# Patient Record
Sex: Male | Born: 1990 | Race: White | Hispanic: No | Marital: Single | Smoking: Current every day smoker
Health system: Southern US, Community
[De-identification: ages and names within clinical notes are randomized; demographics above are authoritative.]

---
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
## 2011-01-15 ENCOUNTER — Emergency Department: Payer: Self-pay | Admitting: Emergency Medicine

## 2011-04-18 LAB — I-STAT 8, (EC8 V) (CONVERTED LAB)
Acid-Base Excess: 1
BUN: 16
Bicarbonate: 25.1 — ABNORMAL HIGH
Chloride: 104
HCT: 50 — ABNORMAL HIGH
Hemoglobin: 17 — ABNORMAL HIGH
Operator id: 272551
Sodium: 139

## 2011-04-18 LAB — URINALYSIS, ROUTINE W REFLEX MICROSCOPIC
Bilirubin Urine: NEGATIVE
Hgb urine dipstick: NEGATIVE
Ketones, ur: NEGATIVE
Nitrite: NEGATIVE
Specific Gravity, Urine: 1.023
pH: 6.5

## 2011-04-18 LAB — POCT I-STAT CREATININE: Creatinine, Ser: 1.1

## 2011-04-18 LAB — DIFFERENTIAL
Basophils Absolute: 0
Basophils Relative: 0
Eosinophils Absolute: 0.1
Eosinophils Relative: 1
Monocytes Absolute: 0.6
Monocytes Relative: 10
Neutro Abs: 3.7

## 2011-04-18 LAB — GC/CHLAMYDIA PROBE AMP, URINE: Chlamydia, Swab/Urine, PCR: NEGATIVE

## 2011-04-18 LAB — CBC
HCT: 45.7
Hemoglobin: 16
MCHC: 34.9
MCV: 85.3
RBC: 5.35
RDW: 13

## 2011-04-18 LAB — ETHANOL: Alcohol, Ethyl (B): <5

## 2011-04-18 LAB — HEPATIC FUNCTION PANEL
ALT: 21
AST: 22
Albumin: 4.3
Alkaline Phosphatase: 143
Bilirubin, Direct: 0.1
Total Bilirubin: 0.6

## 2011-04-18 LAB — RPR: RPR Ser Ql: NONREACTIVE

## 2011-04-18 LAB — RAPID URINE DRUG SCREEN, HOSP PERFORMED
Cocaine: NOT DETECTED
Opiates: NOT DETECTED

## 2016-10-02 ENCOUNTER — Encounter: Payer: Self-pay | Admitting: Emergency Medicine

## 2016-10-02 ENCOUNTER — Ambulatory Visit
Admission: EM | Admit: 2016-10-02 | Discharge: 2016-10-02 | Disposition: A | Discharge status: Home / Self Care | Payer: Self-pay | Attending: Internal Medicine | Admitting: Internal Medicine

## 2016-10-02 DIAGNOSIS — R69 Illness, unspecified: Secondary | ICD-10-CM

## 2016-10-02 DIAGNOSIS — J111 Influenza due to unidentified influenza virus with other respiratory manifestations: Secondary | ICD-10-CM

## 2016-10-02 DIAGNOSIS — J029 Acute pharyngitis, unspecified: Secondary | ICD-10-CM

## 2016-10-02 DIAGNOSIS — R05 Cough: Secondary | ICD-10-CM

## 2016-10-02 DIAGNOSIS — R0981 Nasal congestion: Secondary | ICD-10-CM

## 2016-10-02 MED ORDER — OSELTAMIVIR PHOSPHATE 75 MG PO CAPS: 75.0000 mg | ORAL_CAPSULE | Freq: Two times a day (BID) | ORAL | 0 refills | Status: DC

## 2016-10-02 NOTE — ED Provider Notes (Signed)
[facility]    CSN: [id number] Arrival date & time: 10/02/16  1508     History   Chief Complaint Chief Complaint  Patient presents with  . Nasal Congestion  . Otalgia  . Cough  . Sore Throat    HPI Dakota Allen is a 26 y.o. male.   Pt c/o cough and congestion for a week that has worsened to head congestion and dizziness especially after trying to blow his nose.  Also c/o general malaise.        History reviewed. No pertinent past medical history.  There are no active problems to display for this patient.   History reviewed. No pertinent surgical history.     Home Medications    Prior to Admission medications   Medication Sig Start Date End Date Taking? Authorizing Provider  oseltamivir (TAMIFLU) 75 MG capsule Take 1 capsule (75 mg total) by mouth every 12 (twelve) hours. 10/02/16   Arnaldo Natal, MD    Family History History reviewed. No pertinent family history.  Social History Social History  Substance Use Topics  . Smoking status: Current Every Day Smoker    Packs/day: 1.00    Types: Cigarettes  . Smokeless tobacco: Never Used  . Alcohol use Yes     Allergies   Patient has no known allergies.   Review of Systems Review of Systems  Constitutional: Negative for chills and fever.  HENT: Negative for sore throat and tinnitus.   Eyes: Negative for redness.  Respiratory: Positive for cough. Negative for shortness of breath.   Cardiovascular: Negative for chest pain and palpitations.  Gastrointestinal: Positive for nausea. Negative for abdominal pain, diarrhea and vomiting.  Genitourinary: Negative for dysuria, frequency and urgency.  Musculoskeletal: Negative for myalgias.  Skin: Negative for rash.       No lesions  Neurological: Positive for dizziness. Negative for weakness.  Hematological: Does not bruise/bleed easily.  Psychiatric/Behavioral: Negative for suicidal ideas.     Physical Exam Triage Vital Signs ED Triage  Vitals  Enc Vitals Group     BP 10/02/16 1540 120/82     Pulse Rate 10/02/16 1540 77     Resp 10/02/16 1540 16     Temp 10/02/16 1540 98 F (36.7 C)     Temp Source 10/02/16 1540 Oral     SpO2 10/02/16 1540 99 %     Weight 10/02/16 1538 130 lb (59 kg)     Height --      Head Circumference --      Peak Flow --      Pain Score 10/02/16 1539 0     Pain Loc --      Pain Edu? --      Excl. in GC? --    No data found.   Updated Vital Signs BP 120/82 (BP Location: Left Arm)   Pulse 77   Temp 98 F (36.7 C) (Oral)   Resp 16   Wt 130 lb (59 kg)   SpO2 99%   Visual Acuity Right Eye Distance:   Left Eye Distance:   Bilateral Distance:    Right Eye Near:   Left Eye Near:    Bilateral Near:     Physical Exam  Constitutional: He is oriented to person, place, and time. He appears well-developed and well-nourished. No distress.  HENT:  Head: Normocephalic and atraumatic.  Mouth/Throat: Oropharynx is clear and moist.  Eyes: Conjunctivae and EOM are normal. Pupils are equal, round, and reactive to light.  No scleral icterus.  Neck: Normal range of motion. Neck supple. No JVD present. No tracheal deviation present. No thyromegaly present.  Cardiovascular: Normal rate, regular rhythm and normal heart sounds.  Exam reveals no gallop and no friction rub.   No murmur heard. Pulmonary/Chest: Effort normal and breath sounds normal. No respiratory distress.  Abdominal: Soft. Bowel sounds are normal. He exhibits no distension. There is no tenderness.  Musculoskeletal: Normal range of motion. He exhibits no edema.  Lymphadenopathy:    He has cervical adenopathy.  Neurological: He is alert and oriented to person, place, and time. No cranial nerve deficit.  Skin: Skin is warm and dry. No rash noted. No erythema.  Psychiatric: He has a normal mood and affect. His behavior is normal. Judgment and thought content normal.     UC Treatments / Results  Labs (all labs ordered are listed, but  only abnormal results are displayed) Labs Reviewed - No data to display  EKG  EKG Interpretation None       Radiology No results found.  Procedures Procedures (including critical care time)  Medications Ordered in UC Medications - No data to display   Initial Impression / Assessment and Plan / UC Course  I have reviewed the triage vital signs and the nursing notes.  Pertinent labs & imaging results that were available during my care of the patient were reviewed by me and considered in my medical decision making (see chart for details).     Unspecified viral URI that may have predisposed pt to Influenza-like illness.  Works at [employer] with many probable influenza positive contacts.  Empiric tx.   Final Clinical Impressions(s) / UC Diagnoses   Final diagnoses:  Influenza-like illness    New Prescriptions New Prescriptions   OSELTAMIVIR (TAMIFLU) 75 MG CAPSULE    Take 1 capsule (75 mg total) by mouth every 12 (twelve) hours.     Arnaldo Natal, MD 10/02/16 [phone]

## 2016-10-02 NOTE — ED Triage Notes (Signed)
Patient c/o cough, nasal congestion, and ear pressure for a week.

## 2017-08-06 ENCOUNTER — Emergency Department
Admission: EM | Admit: 2017-08-06 | Discharge: 2017-08-06 | Disposition: A | Discharge status: Home / Self Care | Payer: Worker's Compensation | Attending: Emergency Medicine | Admitting: Emergency Medicine

## 2017-08-06 ENCOUNTER — Encounter: Payer: Self-pay | Admitting: Emergency Medicine

## 2017-08-06 ENCOUNTER — Emergency Department: Payer: Worker's Compensation

## 2017-08-06 DIAGNOSIS — W230XXA Caught, crushed, jammed, or pinched between moving objects, initial encounter: Secondary | ICD-10-CM | POA: Insufficient documentation

## 2017-08-06 DIAGNOSIS — S99921A Unspecified injury of right foot, initial encounter: Secondary | ICD-10-CM | POA: Diagnosis present

## 2017-08-06 DIAGNOSIS — S92534A Nondisplaced fracture of distal phalanx of right lesser toe(s), initial encounter for closed fracture: Secondary | ICD-10-CM | POA: Diagnosis not present

## 2017-08-06 DIAGNOSIS — Z23 Encounter for immunization: Secondary | ICD-10-CM | POA: Diagnosis not present

## 2017-08-06 DIAGNOSIS — S91209A Unspecified open wound of unspecified toe(s) with damage to nail, initial encounter: Secondary | ICD-10-CM

## 2017-08-06 DIAGNOSIS — Y999 Unspecified external cause status: Secondary | ICD-10-CM | POA: Insufficient documentation

## 2017-08-06 DIAGNOSIS — Y9389 Activity, other specified: Secondary | ICD-10-CM | POA: Insufficient documentation

## 2017-08-06 DIAGNOSIS — F17210 Nicotine dependence, cigarettes, uncomplicated: Secondary | ICD-10-CM | POA: Insufficient documentation

## 2017-08-06 DIAGNOSIS — S91114A Laceration without foreign body of right lesser toe(s) without damage to nail, initial encounter: Secondary | ICD-10-CM | POA: Insufficient documentation

## 2017-08-06 DIAGNOSIS — Z79899 Other long term (current) drug therapy: Secondary | ICD-10-CM | POA: Insufficient documentation

## 2017-08-06 DIAGNOSIS — Y9289 Other specified places as the place of occurrence of the external cause: Secondary | ICD-10-CM | POA: Diagnosis not present

## 2017-08-06 MED ORDER — CEPHALEXIN 500 MG PO CAPS: 500.0000 mg | ORAL_CAPSULE | Freq: Four times a day (QID) | ORAL | 0 refills | Status: DC

## 2017-08-06 MED ORDER — OXYCODONE-ACETAMINOPHEN 5-325 MG PO TABS
1.0000 | ORAL_TABLET | Freq: Once | ORAL | Status: AC
  Administered 2017-08-06: 1 via ORAL
  Filled 2017-08-06: qty 1

## 2017-08-06 MED ORDER — HYDROCODONE-ACETAMINOPHEN 5-325 MG PO TABS: 1.0000 | ORAL_TABLET | ORAL | 0 refills | Status: DC | PRN

## 2017-08-06 MED ORDER — TETANUS-DIPHTH-ACELL PERTUSSIS 5-2.5-18.5 LF-MCG/0.5 IM SUSP
0.5000 mL | Freq: Once | INTRAMUSCULAR | Status: AC
  Administered 2017-08-06: 0.5 mL via INTRAMUSCULAR
  Filled 2017-08-06: qty 0.5

## 2017-08-06 NOTE — ED Triage Notes (Addendum)
Patient was at work and ran his right foot over with a pallet. Patient with complaint to his second through fifth toes. Toe nail was removed from and laceration to bottom of toe. Spoke with supervisor Tiffany who stated that patient did not require a UDS.

## 2017-08-06 NOTE — ED Notes (Signed)
Pt says he ran over his right foot with a palette jack at work at [employer] just prior to arrival; toenail on 3rd digit was pulled off; c/o pain to 3 middle toes;

## 2017-08-06 NOTE — ED Notes (Signed)
See triage noted.  Bandage still intact from triage.  Patient to rest room with assistance.

## 2017-08-06 NOTE — ED Provider Notes (Signed)
[facility] Emergency Department Provider Note  ____________________________________________  Time seen: Approximately 9:31 PM  I have reviewed the triage vital signs and the nursing notes.   HISTORY  Chief Complaint Foot Injury    HPI Dakota Allen is a 27 y.o. male who presents the emergency department complaining of right foot injury.  Patient was at work, when he accidentally ran over his foot with a loaded pallet jack.  Patient reports that the injury ripped the nail off of the second digit and caused a laceration to the plantar aspect of the third digit.  Patient reports that the pain to the toes are significant to the second, third, fourth digits.  No pain to the big toe or little toe.  No other injury or complaint.  Patient's last tetanus shot was "at least 20 years ago."  No medications for this complaint prior to arrival.  History reviewed. No pertinent past medical history.  There are no active problems to display for this patient.   History reviewed. No pertinent surgical history.  Prior to Admission medications   Medication Sig Start Date End Date Taking? Authorizing Provider  cephALEXin (KEFLEX) 500 MG capsule Take 1 capsule (500 mg total) by mouth 4 (four) times daily. 08/06/17   Quanta Roher, Delorise Royals, PA-C  HYDROcodone-acetaminophen (NORCO/VICODIN) 5-325 MG tablet Take 1 tablet by mouth every 4 (four) hours as needed for moderate pain. 08/06/17   Yui Mulvaney, Delorise Royals, PA-C  oseltamivir (TAMIFLU) 75 MG capsule Take 1 capsule (75 mg total) by mouth every 12 (twelve) hours. 10/02/16   Arnaldo Natal, MD    Allergies Patient has no known allergies.  No family history on file.  Social History Social History   Tobacco Use  . Smoking status: Current Every Day Smoker    Packs/day: 1.00    Types: Cigarettes  . Smokeless tobacco: Never Used  Substance Use Topics  . Alcohol use: Yes  . Drug use: No     Review of Systems  Constitutional: No  fever/chills Eyes: No visual changes.  Cardiovascular: no chest pain. Respiratory: no cough. No SOB. Gastrointestinal: No abdominal pain.  No nausea, no vomiting.  Musculoskeletal: Positive for pain to the second, third, fourth toe right foot. Skin: Positive for toenail avulsion to the second digit right foot.  Positive for laceration to the plantar aspect of the third digit. Neurological: Negative for headaches, focal weakness or numbness. 10-point ROS otherwise negative.  ____________________________________________   PHYSICAL EXAM:  VITAL SIGNS: ED Triage Vitals  Enc Vitals Group     BP 08/06/17 2004 116/80     Pulse Rate 08/06/17 2004 87     Resp 08/06/17 2004 18     Temp 08/06/17 2004 98.4 F (36.9 C)     Temp Source 08/06/17 2004 Oral     SpO2 08/06/17 2004 99 %     Weight 08/06/17 2005 145 lb (65.8 kg)     Height 08/06/17 2005 5\' 9"  (1.753 m)     Head Circumference --      Peak Flow --      Pain Score 08/06/17 2005 9     Pain Loc --      Pain Edu? --      Excl. in GC? --      Constitutional: Alert and oriented. Well appearing and in no acute distress. Eyes: Conjunctivae are normal. PERRL. EOMI. Head: Atraumatic. Neck: No stridor.    Cardiovascular: Normal rate, regular rhythm. Normal S1 and S2.  Good  peripheral circulation. Respiratory: Normal respiratory effort without tachypnea or retractions. Lungs CTAB. Good air entry to the bases with no decreased or absent breath sounds. Musculoskeletal: Full range of motion to all extremities. No gross deformities appreciated.  No gross deformities of the right foot upon inspection.  Toenail avulsion appreciated to the second digit of the right foot.  Laceration noted to the plantar aspect of the third digit right foot.  Limited range of motion to the toes due to pain.  Sensation intact all 5 digits.  Capillary refill intact all 5 digits. Neurologic:  Normal speech and language. No gross focal neurologic deficits are  appreciated.  Skin:  Skin is warm, dry and intact. No rash noted. Psychiatric: Mood and affect are normal. Speech and behavior are normal. Patient exhibits appropriate insight and judgement.   ____________________________________________   LABS (all labs ordered are listed, but only abnormal results are displayed)  Labs Reviewed - No data to display ____________________________________________  EKG   ____________________________________________  RADIOLOGY Festus BarrenI, Yeng Frankie D Keiarra Charon, personally viewed and evaluated these images (plain radiographs) as part of my medical decision making, as well as reviewing the written report by the radiologist.  Dg Foot Complete Right  Result Date: 08/06/2017 CLINICAL DATA:  Patient was at work and ran his right foot over with a pallet. Patient with complaint to his second through fifth toes. Toe nail was removed from and laceration to bottom of toe. EXAM: RIGHT FOOT COMPLETE - 3+ VIEW COMPARISON:  None. FINDINGS: There is significant artifact from dressing material on the 2nd and 3rd digits. However I suspect a comminuted fracture involving the distal phalanx of the 2nd digit and distal phalanx of the 3rd digit. Possible fracture of the tuft of the 4th digit. No evidence for dislocation. No radiopaque foreign body. IMPRESSION: 1. Detail is limited because of dressing material. Consider dedicated digit views after dressing material is removed. 2. Suspect fractures of the distal phalanges of the 2nd, 3rd, and possibly 4th digits. Electronically Signed   By: Norva PavlovElizabeth  Brown M.D.   On: 08/06/2017 21:11    ____________________________________________    PROCEDURES  Procedure(s) performed:    Marland Kitchen.Marland Kitchen.Laceration Repair Date/Time: 08/06/2017 10:33 PM Performed by: Racheal Patchesuthriell, Kortne All D, PA-C Authorized by: Racheal Patchesuthriell, Oni Dietzman D, PA-C   Consent:    Consent obtained:  Verbal   Consent given by:  Patient   Risks discussed:  Pain, poor cosmetic result, poor wound  healing and infection Anesthesia (see MAR for exact dosages):    Anesthesia method:  None Laceration details:    Location:  Toe   Toe location:  R third toe Repair type:    Repair type:  Simple Pre-procedure details:    Preparation:  Patient was prepped and draped in usual sterile fashion and imaging obtained to evaluate for foreign bodies Exploration:    Hemostasis achieved with:  Direct pressure   Wound exploration: wound explored through full range of motion and entire depth of wound probed and visualized     Wound extent: underlying fracture     Wound extent: no foreign bodies/material noted, no muscle damage noted, no nerve damage noted, no tendon damage noted and no vascular damage noted     Contaminated: no   Treatment:    Area cleansed with:  Betadine   Amount of cleaning:  Extensive   Irrigation solution:  Sterile saline   Irrigation method:  Syringe Skin repair:    Repair method:  Tissue adhesive Approximation:    Approximation:  Close Post-procedure  details:    Dressing:  Splint for protection Comments:     2 lacerations noted to the third digit are covered using Dermabond.  Patient tolerated well.  Postop shoe for protection of underlying fractures.      Medications  oxyCODONE-acetaminophen (PERCOCET/ROXICET) 5-325 MG per tablet 1 tablet (not administered)  Tdap (BOOSTRIX) injection 0.5 mL (0.5 mLs Intramuscular Given 08/06/17 2157)     ____________________________________________   INITIAL IMPRESSION / ASSESSMENT AND PLAN / ED COURSE  Pertinent labs & imaging results that were available during my care of the patient were reviewed by me and considered in my medical decision making (see chart for details).  Review of the [place] CSRS was performed in accordance of the [facility] prior to dispensing any controlled drugs.     Patient's diagnosis is consistent with injury to the right foot with closed fractures of the second, third, fourth digit, toenail avulsion to the  second digit, laceration to the third digit of the right foot.  Patient accidentally ran over his foot with a palate jack at work.  Tetanus shot was updated.  Wound care provided.  Patient requested Dermabond for closure of small lacerations to the third digit as patient has "deathly afraid" of needles.  Dermabond is applied with good success.  Patient's foot is immobilized with a postop shoe.. Patient will be discharged home with prescriptions for antibiotics prophylactically as there are lacerations overlying fractures.  Patient will also be given pain medication to be taken as needed.. Patient is to follow up with podiatry for further management or primary care as needed or otherwise directed. Patient is given ED precautions to return to the ED for any worsening or new symptoms.     ____________________________________________  FINAL CLINICAL IMPRESSION(S) / ED DIAGNOSES  Final diagnoses:  Closed nondisplaced fracture of distal phalanx of lesser toe of right foot, initial encounter  Laceration of lesser toe of right foot without foreign body present or damage to nail, initial encounter  Avulsion of toenail, initial encounter      NEW MEDICATIONS STARTED DURING THIS VISIT:  ED Discharge Orders        Ordered    HYDROcodone-acetaminophen (NORCO/VICODIN) 5-325 MG tablet  Every 4 hours PRN     08/06/17 2239    cephALEXin (KEFLEX) 500 MG capsule  4 times daily     08/06/17 2239          This chart was dictated using voice recognition software/Dragon. Despite best efforts to proofread, errors can occur which can change the meaning. Any change was purely unintentional.    Racheal Patches, PA-C 08/06/17 2240    Myrna Blazer, MD 08/06/17 2251

## 2017-08-08 ENCOUNTER — Ambulatory Visit
Admission: EM | Admit: 2017-08-08 | Discharge: 2017-08-08 | Discharge status: Absent without leave | Payer: PRIVATE HEALTH INSURANCE

## 2017-08-29 ENCOUNTER — Ambulatory Visit
Admission: RE | Admit: 2017-08-29 | Discharge: 2017-08-29 | Disposition: A | Discharge status: Home / Self Care | Payer: Worker's Compensation | Source: Ambulatory Visit | Attending: Physician Assistant | Admitting: Physician Assistant

## 2017-08-29 ENCOUNTER — Other Ambulatory Visit: Payer: Self-pay | Admitting: Physician Assistant

## 2017-08-29 DIAGNOSIS — S92911A Unspecified fracture of right toe(s), initial encounter for closed fracture: Secondary | ICD-10-CM

## 2017-08-29 DIAGNOSIS — X58XXXA Exposure to other specified factors, initial encounter: Secondary | ICD-10-CM | POA: Insufficient documentation

## 2019-09-03 IMAGING — CR DG FOOT COMPLETE 3+V*R*
1 series · 3 of 3 positions shown · non-contrast
Comparison: None.

CLINICAL DATA: Patient was at work and ran his right foot over with
a pallet. Patient with complaint to his second through fifth toes.
Toe nail was removed from and laceration to bottom of toe.

EXAM:
RIGHT FOOT COMPLETE - 3+ VIEW

[Series 1: dg foot complete right · 0.14mm/px · 3 of 3 slices shown]
[im 1/3]
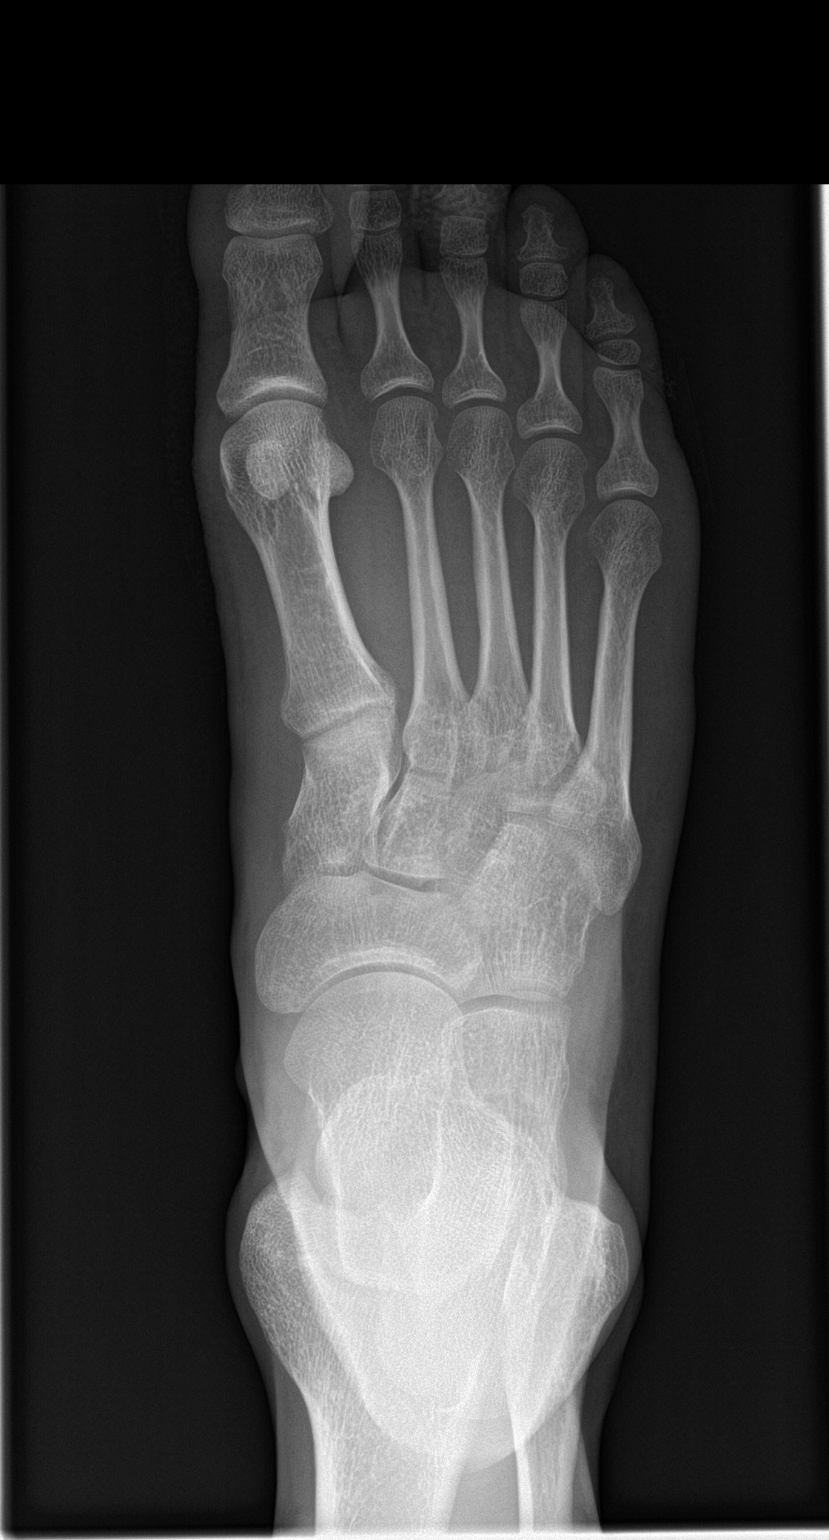
[im 2/3]
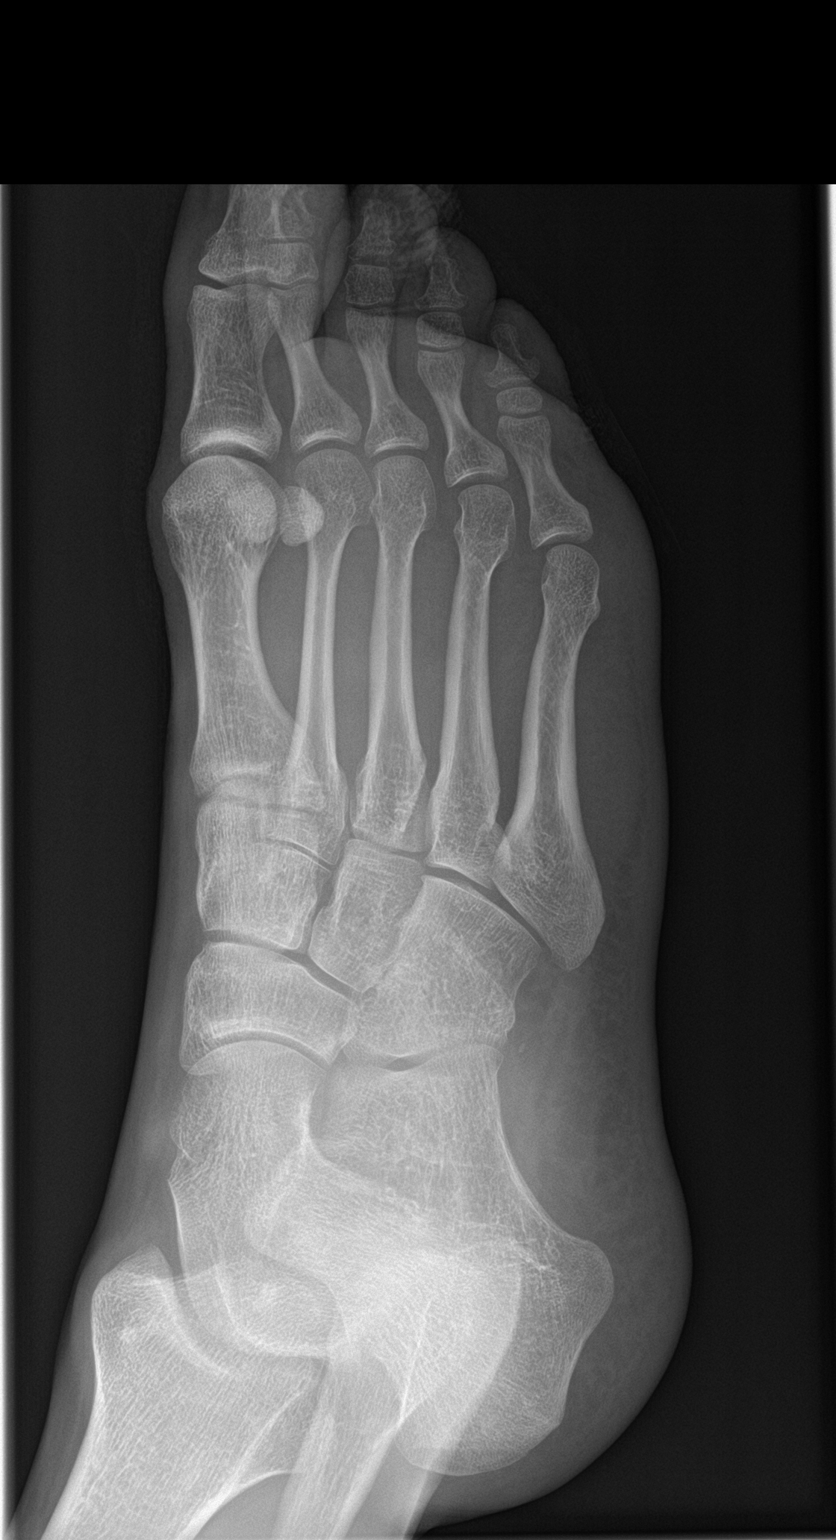
[im 3/3]
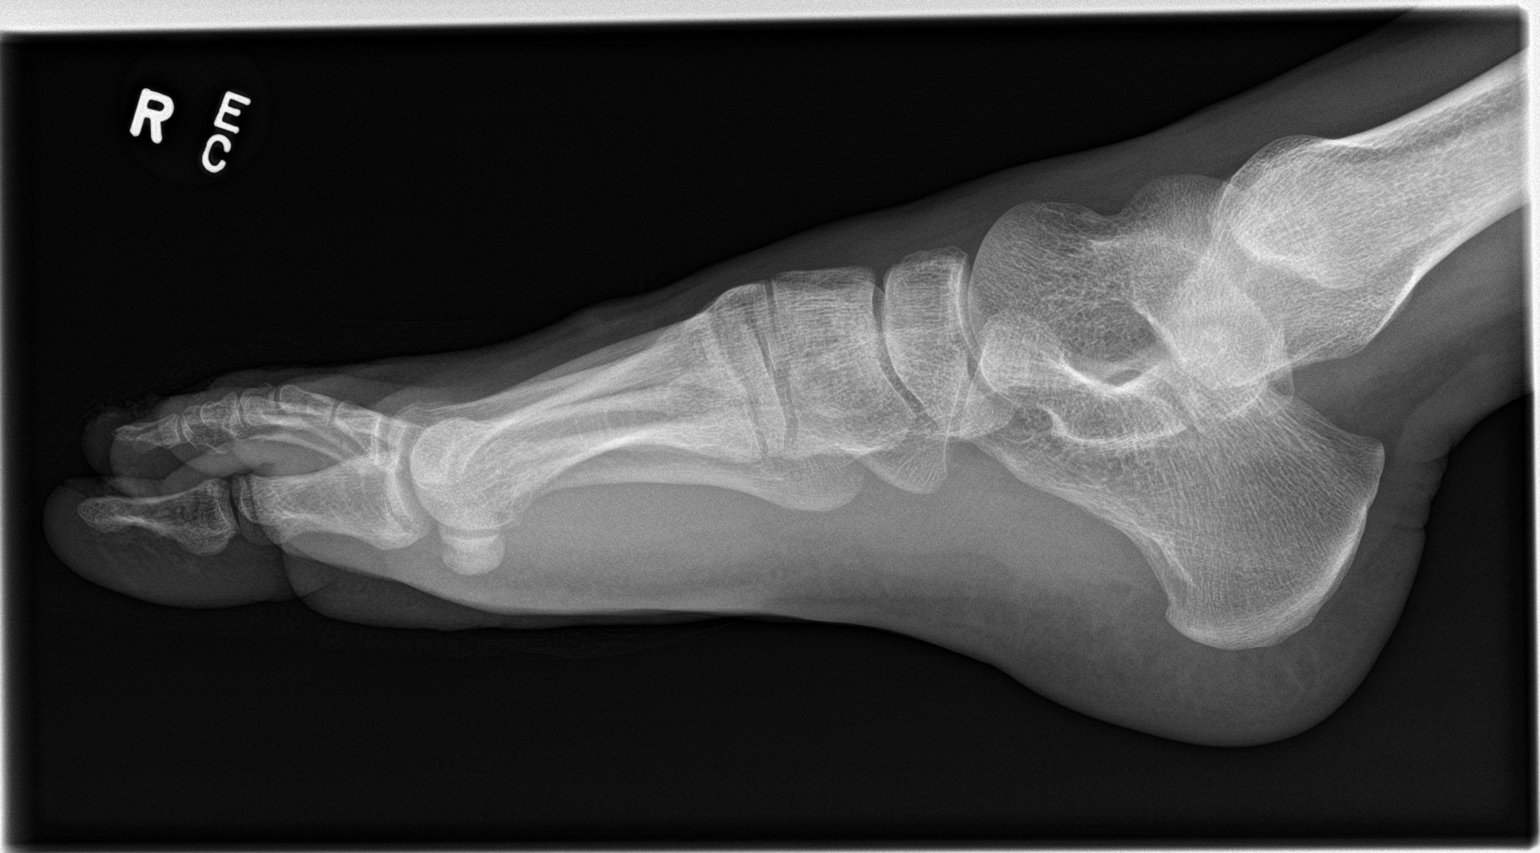

[3 of 3 positions shown; findings below may reference images not displayed]

FINDINGS: There is significant artifact from dressing material on the 2nd and
3rd digits. However I suspect a comminuted fracture involving the
distal phalanx of the 2nd digit and distal phalanx of the 3rd digit.
Possible fracture of the tuft of the 4th digit. No evidence for
dislocation. No radiopaque foreign body.
IMPRESSION: 1. Detail is limited because of dressing material. Consider
dedicated digit views after dressing material is removed.
2. Suspect fractures of the distal phalanges of the 2nd, 3rd, and
possibly 4th digits.

## 2019-09-26 IMAGING — CR DG FOOT COMPLETE 3+V*R*
3 series · 3 of 3 positions shown · non-contrast
Comparison: 08/06/2017

CLINICAL DATA: Crush injury of right foot.

EXAM:
RIGHT FOOT COMPLETE - 3+ VIEW

[foot ap]
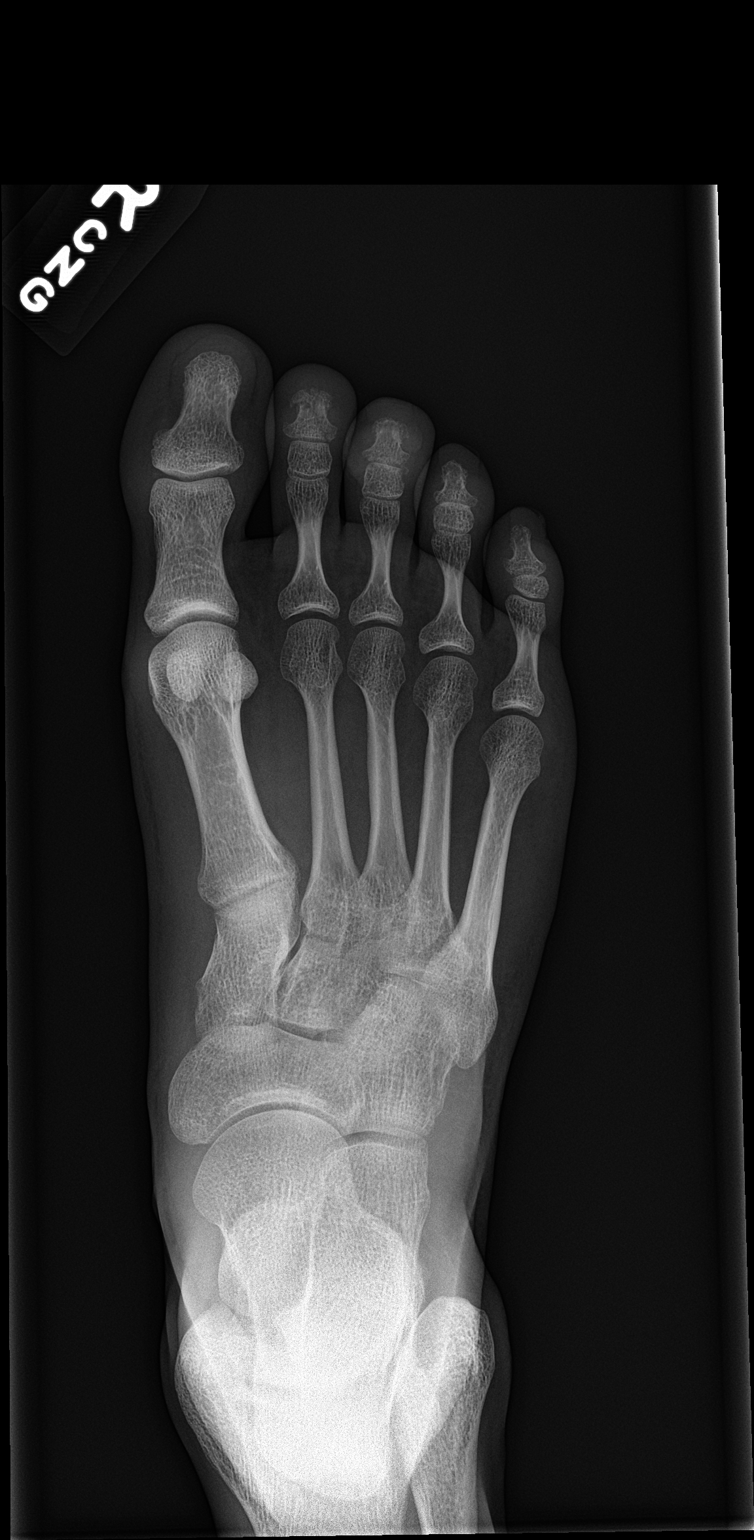

[foot obl]
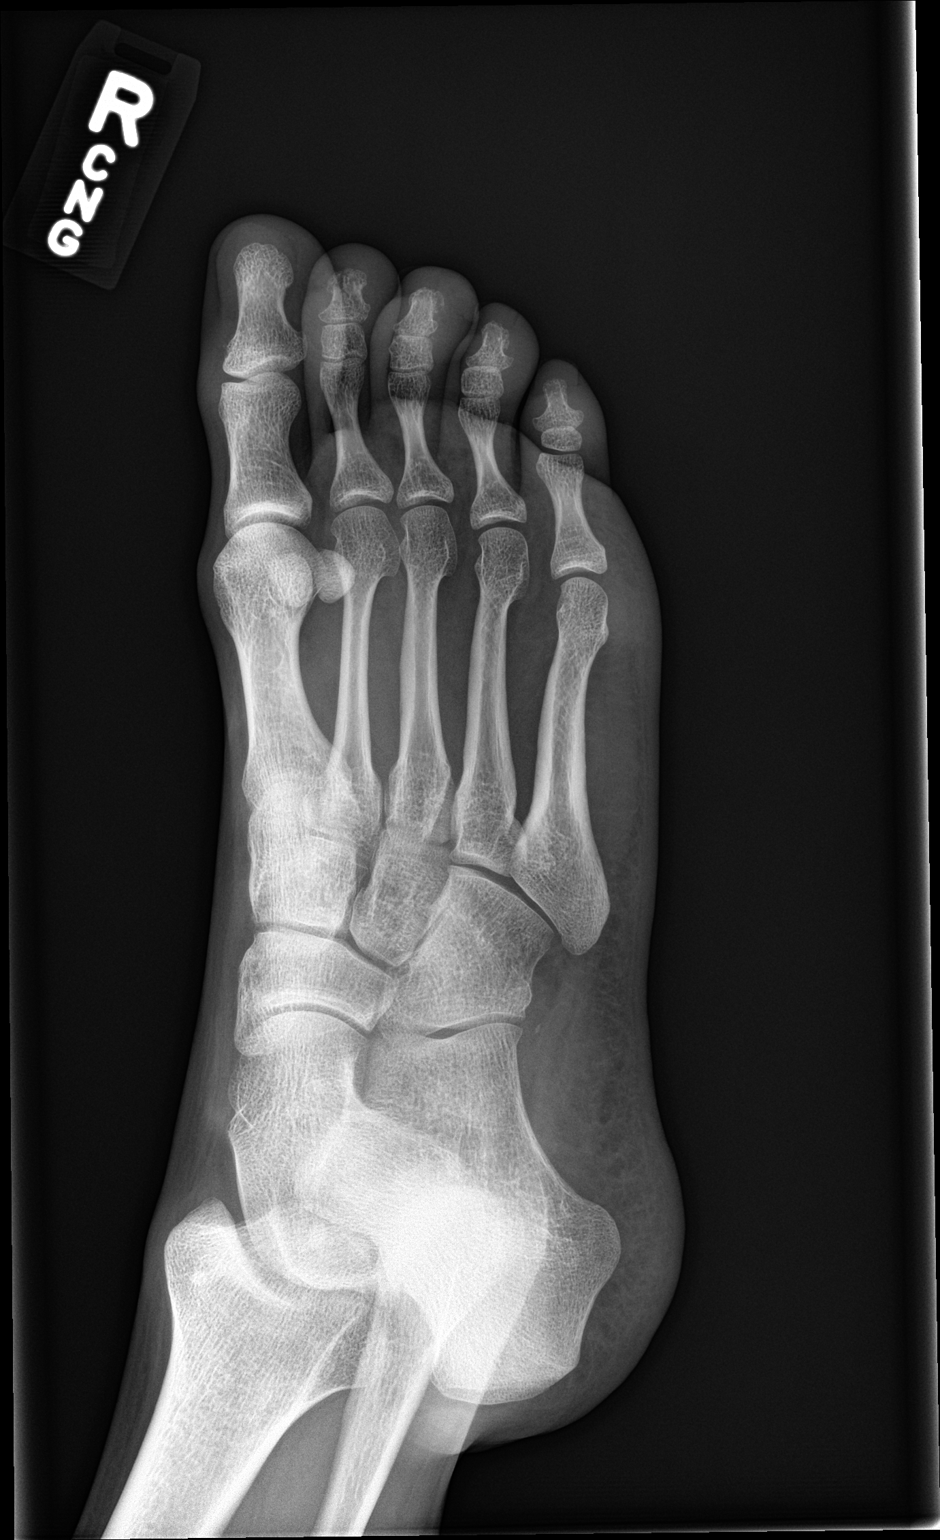

[foot lat]
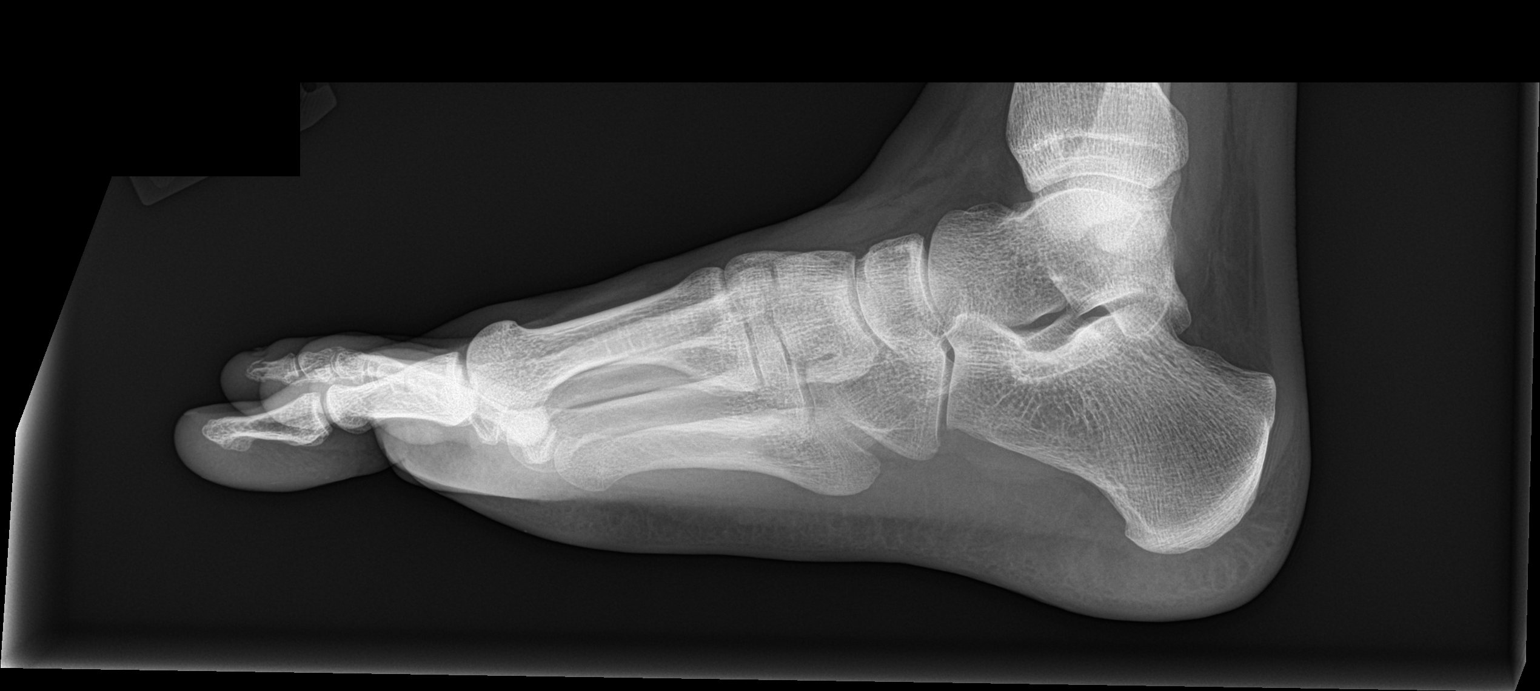

[3 of 3 positions shown; findings below may reference images not displayed]

FINDINGS: There is a comminuted fracture of the distal phalanx of the second
toe. There is a small avulsion fracture from the lateral aspect of
the base of the distal phalanx of the third toe.

No other abnormality.
IMPRESSION: Fractures of the distal phalanges of the second and third toes.

## 2021-03-16 ENCOUNTER — Ambulatory Visit
Admission: EM | Admit: 2021-03-16 | Discharge: 2021-03-16 | Disposition: A | Discharge status: Home / Self Care | Payer: PRIVATE HEALTH INSURANCE | Attending: Physician Assistant | Admitting: Physician Assistant

## 2021-03-16 ENCOUNTER — Other Ambulatory Visit: Payer: Self-pay

## 2021-03-16 ENCOUNTER — Encounter: Payer: Self-pay | Admitting: Emergency Medicine

## 2021-03-16 DIAGNOSIS — Z20822 Contact with and (suspected) exposure to covid-19: Secondary | ICD-10-CM | POA: Insufficient documentation

## 2021-03-16 DIAGNOSIS — J029 Acute pharyngitis, unspecified: Secondary | ICD-10-CM | POA: Insufficient documentation

## 2021-03-16 DIAGNOSIS — J069 Acute upper respiratory infection, unspecified: Secondary | ICD-10-CM | POA: Insufficient documentation

## 2021-03-16 DIAGNOSIS — R0981 Nasal congestion: Secondary | ICD-10-CM

## 2021-03-16 DIAGNOSIS — F17210 Nicotine dependence, cigarettes, uncomplicated: Secondary | ICD-10-CM | POA: Insufficient documentation

## 2021-03-16 DIAGNOSIS — R059 Cough, unspecified: Secondary | ICD-10-CM

## 2021-03-16 LAB — SARS CORONAVIRUS 2 (TAT 6-24 HRS): SARS Coronavirus 2: NEGATIVE

## 2021-03-16 LAB — GROUP A STREP BY PCR: Group A Strep by PCR: NOT DETECTED

## 2021-03-16 MED ORDER — PSEUDOEPH-BROMPHEN-DM 30-2-10 MG/5ML PO SYRP: 10.0000 mL | ORAL_SOLUTION | Freq: Four times a day (QID) | ORAL | 0 refills | Status: AC | PRN

## 2021-03-16 NOTE — ED Provider Notes (Signed)
[facility]    CSN: [id number] Arrival date & time: 03/16/21  7510      History   Chief Complaint Chief Complaint  Patient presents with   Nasal Congestion   Cough   Sore Throat    HPI Dakota Allen is a 30 y.o. male presents for 5 day history of nasal congestion, cough, and sore throat. He denies sick contacts or known COVID exposure. He did take 2 at home COVID tests which were negative.  Vaccinated for COVID-19 x2.  Patient does not report any fevers, fatigue, aches, breathing difficulty, vomiting or diarrhea.  He has not taken any over-the-counter medication for his symptoms.  He is otherwise healthy.  He has no other complaints or concerns.  HPI  History reviewed. No pertinent past medical history.  There are no problems to display for this patient.   History reviewed. No pertinent surgical history.     Home Medications    Prior to Admission medications   Medication Sig Start Date End Date Taking? Authorizing Provider  brompheniramine-pseudoephedrine-DM 30-2-10 MG/5ML syrup Take 10 mLs by mouth 4 (four) times daily as needed for up to 7 days. 03/16/21 03/23/21 Yes Shirlee Latch, PA-C    Family History History reviewed. No pertinent family history.  Social History Social History   Tobacco Use   Smoking status: Every Day    Packs/day: 1.00    Types: Cigarettes   Smokeless tobacco: Never  Substance Use Topics   Alcohol use: Yes   Drug use: No     Allergies   Patient has no known allergies.   Review of Systems Review of Systems  Constitutional:  Negative for fatigue and fever.  HENT:  Positive for congestion, rhinorrhea and sore throat. Negative for sinus pressure and sinus pain.   Respiratory:  Positive for cough. Negative for shortness of breath.   Gastrointestinal:  Negative for abdominal pain, diarrhea, nausea and vomiting.  Musculoskeletal:  Negative for myalgias.  Neurological:  Negative for weakness, light-headedness and  headaches.  Hematological:  Positive for adenopathy.    Physical Exam Triage Vital Signs ED Triage Vitals  Enc Vitals Group     BP 03/16/21 0914 117/78     Pulse Rate 03/16/21 0914 99     Resp 03/16/21 0914 18     Temp 03/16/21 0914 98.3 F (36.8 C)     Temp Source 03/16/21 0914 Oral     SpO2 03/16/21 0914 99 %     Weight --      Height --      Head Circumference --      Peak Flow --      Pain Score 03/16/21 0917 0     Pain Loc --      Pain Edu? --      Excl. in GC? --    No data found.  Updated Vital Signs BP 117/78 (BP Location: Right Arm)   Pulse 99   Temp 98.3 F (36.8 C) (Oral)   Resp 18   SpO2 99%      Physical Exam Vitals and nursing note reviewed.  Constitutional:      General: He is not in acute distress.    Appearance: Normal appearance. He is well-developed. He is not ill-appearing or diaphoretic.  HENT:     Head: Normocephalic and atraumatic.     Right Ear: Tympanic membrane, ear canal and external ear normal.     Left Ear: Tympanic membrane, ear canal and external ear normal.  Nose: Congestion present.     Mouth/Throat:     Mouth: Mucous membranes are moist.     Pharynx: Oropharynx is clear. Uvula midline. Posterior oropharyngeal erythema present.     Tonsils: No tonsillar abscesses.  Eyes:     General: No scleral icterus.       Right eye: No discharge.        Left eye: No discharge.     Conjunctiva/sclera: Conjunctivae normal.  Neck:     Thyroid: No thyromegaly.     Trachea: No tracheal deviation.  Cardiovascular:     Rate and Rhythm: Normal rate and regular rhythm.     Heart sounds: Normal heart sounds.  Pulmonary:     Effort: Pulmonary effort is normal. No respiratory distress.     Breath sounds: Normal breath sounds. No wheezing, rhonchi or rales.  Musculoskeletal:     Cervical back: Neck supple.  Lymphadenopathy:     Cervical: Cervical adenopathy present.  Skin:    General: Skin is warm and dry.     Findings: No rash.   Neurological:     General: No focal deficit present.     Mental Status: He is alert. Mental status is at baseline.     Motor: No weakness.     Gait: Gait normal.  Psychiatric:        Mood and Affect: Mood normal.        Behavior: Behavior normal.        Thought Content: Thought content normal.     UC Treatments / Results  Labs (all labs ordered are listed, but only abnormal results are displayed) Labs Reviewed  SARS CORONAVIRUS 2 (TAT 6-24 HRS)  GROUP A STREP BY PCR    EKG   Radiology No results found.  Procedures Procedures (including critical care time)  Medications Ordered in UC Medications - No data to display  Initial Impression / Assessment and Plan / UC Course  I have reviewed the triage vital signs and the nursing notes.  Pertinent labs & imaging results that were available during my care of the patient were reviewed by me and considered in my medical decision making (see chart for details).   30 y/o male presenting for 5-day history of sore throat, congestion and cough.  Vital signs all normal and stable and he is well-appearing.  Exam significant for mild posterior pharyngeal erythema and congestion.  Chest clear to auscultation heart regular rate and rhythm.  PCR strep test ordered. Negative.  PCR COVID test obtained.  Current CDC guidelines, isolation protocol and ED precautions reviewed with patient.  Advised patient symptoms consistent with viral URI.  Encouraged increasing rest and fluids.  Sent in [facility].  Work note given.  Return and ED precautions reviewed.   Final Clinical Impressions(s) / UC Diagnoses   Final diagnoses:  Upper respiratory tract infection, unspecified type  Cough  Nasal congestion     Discharge Instructions      I will call if strep test is positive.  URI/COLD SYMPTOMS: Your exam today is consistent with a viral illness. Antibiotics are not indicated at this time. Use medications as directed, including cough syrup,  nasal saline, and decongestants. Your symptoms should improve over the next few days and resolve within 7-10 days. Increase rest and fluids. F/u if symptoms worsen or predominate such as sore throat, ear pain, productive cough, shortness of breath, or if you develop high fevers or worsening fatigue over the next several days.    You have  received COVID testing today either for positive exposure, concerning symptoms that could be related to COVID infection, screening purposes, or re-testing after confirmed positive.  Your test obtained today checks for active viral infection in the last 1-2 weeks. If your test is negative now, you can still test positive later. So, if you do develop symptoms you should either get re-tested and/or isolate x 5 days and then strict mask use x 5 days (unvaccinated) or mask use x 10 days (vaccinated). Please follow CDC guidelines.  While Rapid antigen tests come back in 15-20 minutes, send out PCR/molecular test results typically come back within 1-3 days. In the mean time, if you are symptomatic, assume this could be a positive test and treat/monitor yourself as if you do have COVID.   We will call with test results if positive. Please download the MyChart app and set up a profile to access test results.   If symptomatic, go home and rest. Push fluids. Take Tylenol as needed for discomfort. Gargle warm salt water. Throat lozenges. Take Mucinex DM or Robitussin for cough. Humidifier in bedroom to ease coughing. Warm showers. Also review the COVID handout for more information.  COVID-19 INFECTION: The incubation period of COVID-19 is approximately 14 days after exposure, with most symptoms developing in roughly 4-5 days. Symptoms may range in severity from mild to critically severe. Roughly 80% of those infected will have mild symptoms. People of any age may become infected with COVID-19 and have the ability to transmit the virus. The most common symptoms include: fever,  fatigue, cough, body aches, headaches, sore throat, nasal congestion, shortness of breath, nausea, vomiting, diarrhea, changes in smell and/or taste.    COURSE OF ILLNESS Some patients may begin with mild disease which can progress quickly into critical symptoms. If your symptoms are worsening please call ahead to the Emergency Department and proceed there for further treatment. Recovery time appears to be roughly 1-2 weeks for mild symptoms and 3-6 weeks for severe disease.   GO IMMEDIATELY TO ER FOR FEVER YOU ARE UNABLE TO GET DOWN WITH TYLENOL, BREATHING PROBLEMS, CHEST PAIN, FATIGUE, LETHARGY, INABILITY TO EAT OR DRINK, ETC  QUARANTINE AND ISOLATION: To help decrease the spread of COVID-19 please remain isolated if you have COVID infection or are highly suspected to have COVID infection. This means -stay home and isolate to one room in the home if you live with others. Do not share a bed or bathroom with others while ill, sanitize and wipe down all countertops and keep common areas clean and disinfected. Stay home for 5 days. If you have no symptoms or your symptoms are resolving after 5 days, you can leave your house. Continue to wear a mask around others for 5 additional days. If you have been in close contact (within 6 feet) of someone diagnosed with COVID 19, you are advised to quarantine in your home for 14 days as symptoms can develop anywhere from 2-14 days after exposure to the virus. If you develop symptoms, you  must isolate.  Most current guidelines for COVID after exposure -unvaccinated: isolate 5 days and strict mask use x 5 days. Test on day 5 is possible -vaccinated: wear mask x 10 days if symptoms do not develop -You do not necessarily need to be tested for COVID if you have + exposure and  develop symptoms. Just isolate at home x10 days from symptom onset During this global pandemic, CDC advises to practice social distancing, try to stay at least 54ft away  from others at all times.  Wear a face covering. Wash and sanitize your hands regularly and avoid going anywhere that is not necessary.  KEEP IN MIND THAT THE COVID TEST IS NOT 100% ACCURATE AND YOU SHOULD STILL DO EVERYTHING TO PREVENT POTENTIAL SPREAD OF VIRUS TO OTHERS (WEAR MASK, WEAR GLOVES, WASH HANDS AND SANITIZE REGULARLY). IF INITIAL TEST IS NEGATIVE, THIS MAY NOT MEAN YOU ARE DEFINITELY NEGATIVE. MOST ACCURATE TESTING IS DONE 5-7 DAYS AFTER EXPOSURE.   It is not advised by CDC to get re-tested after receiving a positive COVID test since you can still test positive for weeks to months after you have already cleared the virus.   *If you have not been vaccinated for COVID, I strongly suggest you consider getting vaccinated as long as there are no contraindications.       ED Prescriptions     Medication Sig Dispense Auth. Provider   brompheniramine-pseudoephedrine-DM 30-2-10 MG/5ML syrup Take 10 mLs by mouth 4 (four) times daily as needed for up to 7 days. 150 mL Shirlee Latch, PA-C      PDMP not reviewed this encounter.   Shirlee Latch, PA-C 03/16/21 1035

## 2021-03-16 NOTE — ED Triage Notes (Signed)
Pt presents today with c/o of nasal/chest congestion, sore throat, cough x 5 days. Home Covid tests x 2, neg. (Last 3 days ago).

## 2021-03-16 NOTE — Discharge Instructions (Addendum)
I will call if strep test is positive.  URI/COLD SYMPTOMS: Your exam today is consistent with a viral illness. Antibiotics are not indicated at this time. Use medications as directed, including cough syrup, nasal saline, and decongestants. Your symptoms should improve over the next few days and resolve within 7-10 days. Increase rest and fluids. F/u if symptoms worsen or predominate such as sore throat, ear pain, productive cough, shortness of breath, or if you develop high fevers or worsening fatigue over the next several days.    You have received COVID testing today either for positive exposure, concerning symptoms that could be related to COVID infection, screening purposes, or re-testing after confirmed positive.  Your test obtained today checks for active viral infection in the last 1-2 weeks. If your test is negative now, you can still test positive later. So, if you do develop symptoms you should either get re-tested and/or isolate x 5 days and then strict mask use x 5 days (unvaccinated) or mask use x 10 days (vaccinated). Please follow CDC guidelines.  While Rapid antigen tests come back in 15-20 minutes, send out PCR/molecular test results typically come back within 1-3 days. In the mean time, if you are symptomatic, assume this could be a positive test and treat/monitor yourself as if you do have COVID.   We will call with test results if positive. Please download the MyChart app and set up a profile to access test results.   If symptomatic, go home and rest. Push fluids. Take Tylenol as needed for discomfort. Gargle warm salt water. Throat lozenges. Take Mucinex DM or Robitussin for cough. Humidifier in bedroom to ease coughing. Warm showers. Also review the COVID handout for more information.  COVID-19 INFECTION: The incubation period of COVID-19 is approximately 14 days after exposure, with most symptoms developing in roughly 4-5 days. Symptoms may range in severity from mild to  critically severe. Roughly 80% of those infected will have mild symptoms. People of any age may become infected with COVID-19 and have the ability to transmit the virus. The most common symptoms include: fever, fatigue, cough, body aches, headaches, sore throat, nasal congestion, shortness of breath, nausea, vomiting, diarrhea, changes in smell and/or taste.    COURSE OF ILLNESS Some patients may begin with mild disease which can progress quickly into critical symptoms. If your symptoms are worsening please call ahead to the Emergency Department and proceed there for further treatment. Recovery time appears to be roughly 1-2 weeks for mild symptoms and 3-6 weeks for severe disease.   GO IMMEDIATELY TO ER FOR FEVER YOU ARE UNABLE TO GET DOWN WITH TYLENOL, BREATHING PROBLEMS, CHEST PAIN, FATIGUE, LETHARGY, INABILITY TO EAT OR DRINK, ETC  QUARANTINE AND ISOLATION: To help decrease the spread of COVID-19 please remain isolated if you have COVID infection or are highly suspected to have COVID infection. This means -stay home and isolate to one room in the home if you live with others. Do not share a bed or bathroom with others while ill, sanitize and wipe down all countertops and keep common areas clean and disinfected. Stay home for 5 days. If you have no symptoms or your symptoms are resolving after 5 days, you can leave your house. Continue to wear a mask around others for 5 additional days. If you have been in close contact (within 6 feet) of someone diagnosed with COVID 19, you are advised to quarantine in your home for 14 days as symptoms can develop anywhere from 2-14 days after exposure  to the virus. If you develop symptoms, you  must isolate.  Most current guidelines for COVID after exposure -unvaccinated: isolate 5 days and strict mask use x 5 days. Test on day 5 is possible -vaccinated: wear mask x 10 days if symptoms do not develop -You do not necessarily need to be tested for COVID if you have +  exposure and  develop symptoms. Just isolate at home x10 days from symptom onset During this global pandemic, CDC advises to practice social distancing, try to stay at least 17ft away from others at all times. Wear a face covering. Wash and sanitize your hands regularly and avoid going anywhere that is not necessary.  KEEP IN MIND THAT THE COVID TEST IS NOT 100% ACCURATE AND YOU SHOULD STILL DO EVERYTHING TO PREVENT POTENTIAL SPREAD OF VIRUS TO OTHERS (WEAR MASK, WEAR GLOVES, WASH HANDS AND SANITIZE REGULARLY). IF INITIAL TEST IS NEGATIVE, THIS MAY NOT MEAN YOU ARE DEFINITELY NEGATIVE. MOST ACCURATE TESTING IS DONE 5-7 DAYS AFTER EXPOSURE.   It is not advised by CDC to get re-tested after receiving a positive COVID test since you can still test positive for weeks to months after you have already cleared the virus.   *If you have not been vaccinated for COVID, I strongly suggest you consider getting vaccinated as long as there are no contraindications.
